# Patient Record
Sex: Male | Born: 1990 | Race: White | Hispanic: No | Marital: Single | Smoking: Former smoker
Health system: Southern US, Community
[De-identification: ages and names within clinical notes are randomized; demographics above are authoritative.]

## PROBLEM LIST (undated history)

## (undated) DIAGNOSIS — E669 Obesity, unspecified: Secondary | ICD-10-CM

## (undated) HISTORY — PX: NO PAST SURGERIES: SHX2092

## (undated) HISTORY — DX: Obesity, unspecified: E66.9

---

## 2014-12-14 ENCOUNTER — Ambulatory Visit: Payer: Managed Care, Other (non HMO)

## 2014-12-14 ENCOUNTER — Ambulatory Visit
Admission: EM | Admit: 2014-12-14 | Discharge: 2014-12-14 | Disposition: A | Discharge status: Home / Self Care | Payer: Managed Care, Other (non HMO) | Attending: Family Medicine | Admitting: Family Medicine

## 2014-12-14 ENCOUNTER — Encounter: Payer: Self-pay | Admitting: Emergency Medicine

## 2014-12-14 DIAGNOSIS — S93401A Sprain of unspecified ligament of right ankle, initial encounter: Secondary | ICD-10-CM | POA: Insufficient documentation

## 2014-12-14 DIAGNOSIS — Z87891 Personal history of nicotine dependence: Secondary | ICD-10-CM | POA: Diagnosis not present

## 2014-12-14 DIAGNOSIS — W010XXA Fall on same level from slipping, tripping and stumbling without subsequent striking against object, initial encounter: Secondary | ICD-10-CM | POA: Insufficient documentation

## 2014-12-14 DIAGNOSIS — M25571 Pain in right ankle and joints of right foot: Secondary | ICD-10-CM | POA: Diagnosis present

## 2014-12-14 NOTE — ED Notes (Signed)
Right ankle pain and swelling today. Was wrestling last night  And fell back with foot behind him.

## 2014-12-14 NOTE — ED Provider Notes (Signed)
CSN: [id number]     Arrival date & time 12/14/14  1410 History   First MD Initiated Contact with Patient 12/14/14 1442     Chief Complaint  Patient presents with  . Ankle Pain   (Consider location/radiation/quality/duration/timing/severity/associated sxs/prior Treatment) HPI  24 yo M rough housing in the yard last night  with friends, slipped backwards with right foot and ankle beneath him-- eversion injury- was very swollen - reports painful to walk - can weight bear- has pain bilateral malleous. Has been on ice all day. Mother brought to [facility] and wants xray done.  History reviewed. No pertinent past medical history. History reviewed. No pertinent past surgical history. Family History  Problem Relation Age of Onset  . Heart attack Paternal Grandfather   . Heart attack Maternal Grandfather    History  Substance Use Topics  . Smoking status: Former Games developer  . Smokeless tobacco: Current User  . Alcohol Use: Yes    Review of Systems  Review of 10 systems negative for acute change except as referenced in HPI  Allergies  Review of patient's allergies indicates no known allergies.  Home Medications   Prior to Admission medications   Not on File   BP 129/82 mmHg  Pulse 72  Temp(Src) 97.9 F (36.6 C) (Oral)  Resp 18  Ht 6\' 2"  (1.88 m)  Wt 225 lb (102.059 kg)  BMI 28.88 kg/m2  SpO2 97% Physical Exam 'Constitutional -alert and oriented,well appearing and in mild right ankle distress Head-atraumatic Eyes- conjunctiva normal, EOMI ,conjugate gaze- contacts Nose- no congestion or rhinorrhea Mouth/throat- mucous membranes moist , Neck- supple  CV- regular rate, grossly normal heart sounds,  Resp-no distress, normal respiratory effort,clear to auscultation bilaterally GI- no distention GU- not examined MSK- Right ankle mild edema , no ecchymosis, complains of pain with palpation over malelous, nonspecifc  left :no lower extremity tenderness nor edema,no joint effusion,  ambulatory Neuro- normal speech and language, no gross focal neurological deficit appreciated, Skin-warm,dry ,intact; no rash noted Psych-mood and affect grossly normal; speech and behavior grossly normal ED Course  Procedures (including critical care time) Labs Review Labs Reviewed - No data to display  Imaging Review Dg Ankle Complete Right  12/14/2014   CLINICAL DATA:  Fall, ankle pain  EXAM: RIGHT ANKLE - COMPLETE 3+ VIEW  COMPARISON:  None.  FINDINGS: There is no evidence of fracture, dislocation, or joint effusion. There is no evidence of arthropathy or other focal bone abnormality. Soft tissues are unremarkable.  IMPRESSION: Negative.   Electronically Signed   By: Christiana Pellant M.D.   On: 12/14/2014 15:47     MDM   1. Ankle sprain, right, initial encounter    Very mild ankle sprain. 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 He requested off work pass for today as he had stayed home. Left department wearing flip flops normal gait.    Rae Halsted, PA-C 12/15/14 2314

## 2014-12-15 ENCOUNTER — Encounter: Payer: Self-pay | Admitting: Physician Assistant

## 2015-05-12 ENCOUNTER — Encounter: Payer: Self-pay | Admitting: Emergency Medicine

## 2015-05-12 ENCOUNTER — Emergency Department
Admission: EM | Admit: 2015-05-12 | Discharge: 2015-05-12 | Disposition: A | Discharge status: Home / Self Care | Payer: Managed Care, Other (non HMO) | Attending: Student | Admitting: Student

## 2015-05-12 DIAGNOSIS — Z79899 Other long term (current) drug therapy: Secondary | ICD-10-CM | POA: Insufficient documentation

## 2015-05-12 DIAGNOSIS — Z87891 Personal history of nicotine dependence: Secondary | ICD-10-CM | POA: Insufficient documentation

## 2015-05-12 DIAGNOSIS — L0201 Cutaneous abscess of face: Secondary | ICD-10-CM | POA: Insufficient documentation

## 2015-05-12 MED ORDER — CLINDAMYCIN HCL 300 MG PO CAPS: 300.0000 mg | ORAL_CAPSULE | Freq: Three times a day (TID) | ORAL | Status: DC

## 2015-05-12 MED ORDER — LIDOCAINE HCL (PF) 1 % IJ SOLN
5.0000 mL | Freq: Once | INTRAMUSCULAR | Status: DC
  Filled 2015-05-12: qty 5

## 2015-05-12 NOTE — Discharge Instructions (Signed)

## 2015-05-12 NOTE — ED Notes (Signed)
Pt to ed with swelling, redness to forehead.  Pt states "I popped a zit last night to relieve some of the pressure"  Pt reports area has been increasing in size and pain x 5 days.  Pt with noted swelling under eyes bilat.

## 2015-05-12 NOTE — ED Provider Notes (Signed)
CSN: [id number]     Arrival date & time 05/12/15  0911 History   First MD Initiated Contact with Patient 05/12/15 [phone]     Chief Complaint  Patient presents with  . Abscess   HPI Comments: 24 year old male presents today complaining of redness and swelling to his forehead for the past 4-5 days. Patient reports he popped a zit on his forehead last night and now he has swelling on his face. He has not had any fevers, sweats or chills. No personal history of MRSA.  The history is provided by the patient.    History reviewed. No pertinent past medical history. History reviewed. No pertinent past surgical history. Family History  Problem Relation Age of Onset  . Heart attack Paternal Grandfather   . Heart attack Maternal Grandfather    Social History  Substance Use Topics  . Smoking status: Former Games developermoker  . Smokeless tobacco: Current User  . Alcohol Use: Yes    Review of Systems  Constitutional: Negative for fever and chills.  Skin: Positive for wound.  All other systems reviewed and are negative.     Allergies  Doxycycline and Septra  Home Medications   Prior to Admission medications   Medication Sig Start Date End Date Taking? Authorizing Provider  doxycycline (VIBRAMYCIN) 100 MG capsule Take 100 mg by mouth 2 (two) times daily.   Yes Historical Provider, MD  clindamycin (CLEOCIN) 300 MG capsule Take 1 capsule (300 mg total) by mouth 3 (three) times daily. 05/12/15   Wilber OliphantEmma Weavil V, PA-C   BP 127/69 mmHg  Pulse 60  Temp(Src) 98.3 F (36.8 C) (Oral)  Resp 20  Ht 6\' 2"  (1.88 m)  Wt 230 lb (104.327 kg)  BMI 29.52 kg/m2  SpO2 96% Physical Exam  Constitutional: He is oriented to person, place, and time. He appears well-developed and well-nourished.  HENT:  Head: Normocephalic and atraumatic.  Neurological: He is oriented to person, place, and time.  Skin: Skin is warm and dry. There is erythema.  Erythematous, indurated area to central forehead, fluctuant   Psychiatric: He has a normal mood and affect. His behavior is normal. Judgment and thought content normal.  Nursing note and vitals reviewed.   ED Course  .Marland Kitchen.Incision and Drainage Date/Time: 05/12/2015 10:30 AM Performed by: Luvenia ReddenWEAVIL, Maddi Collar V Authorized by: Luvenia ReddenWEAVIL, Dainelle Hun V Consent: Verbal consent obtained. Written consent not obtained. Risks and benefits: risks, benefits and alternatives were discussed Consent given by: patient Patient understanding: patient states understanding of the procedure being performed Patient consent: the patient's understanding of the procedure matches consent given Procedure consent: procedure consent matches procedure scheduled Relevant documents: relevant documents present and verified Required items: required blood products, implants, devices, and special equipment available Patient identity confirmed: verbally with patient Time out: Immediately prior to procedure a "time out" was called to verify the correct patient, procedure, equipment, support staff and site/side marked as required. Type: abscess Body area: head Location details: face Anesthesia: local infiltration Local anesthetic: lidocaine 1% without epinephrine Anesthetic total: 4 ml Patient sedated: no Scalpel size: 11 Incision type: single straight Incision depth: dermal Complexity: simple Drainage: purulent Drainage amount: scant Wound treatment: drain placed and  wound left open Packing material: none Patient tolerance: Patient tolerated the procedure well with no immediate complications   (including critical care time) Labs Review Labs Reviewed - No data to display  Imaging Review No results found. I have personally reviewed and evaluated these images and lab results as part of my medical decision-making.  EKG Interpretation None      MDM  Wound incised and drained as above. Due to drug allergies will treat with clindamycin TID for 10 days Advised patient that should the  swelling or area of redness increase, or if he develops, fever/chills he should return to the ER. Otherwise complete abx course as directed and clean wound daily with soap and water, apply warm compresses  Final diagnoses:  Abscess of forehead        Luvenia Redden, PA-C 05/12/15 1045  Gayla Doss, MD 05/13/15 1320

## 2016-03-12 IMAGING — CR DG ANKLE COMPLETE 3+V*R*
3 series · 3 of 3 positions shown · non-contrast
Comparison: None.

CLINICAL DATA: Fall, ankle pain

EXAM:
RIGHT ANKLE - COMPLETE 3+ VIEW

[ankle ap]
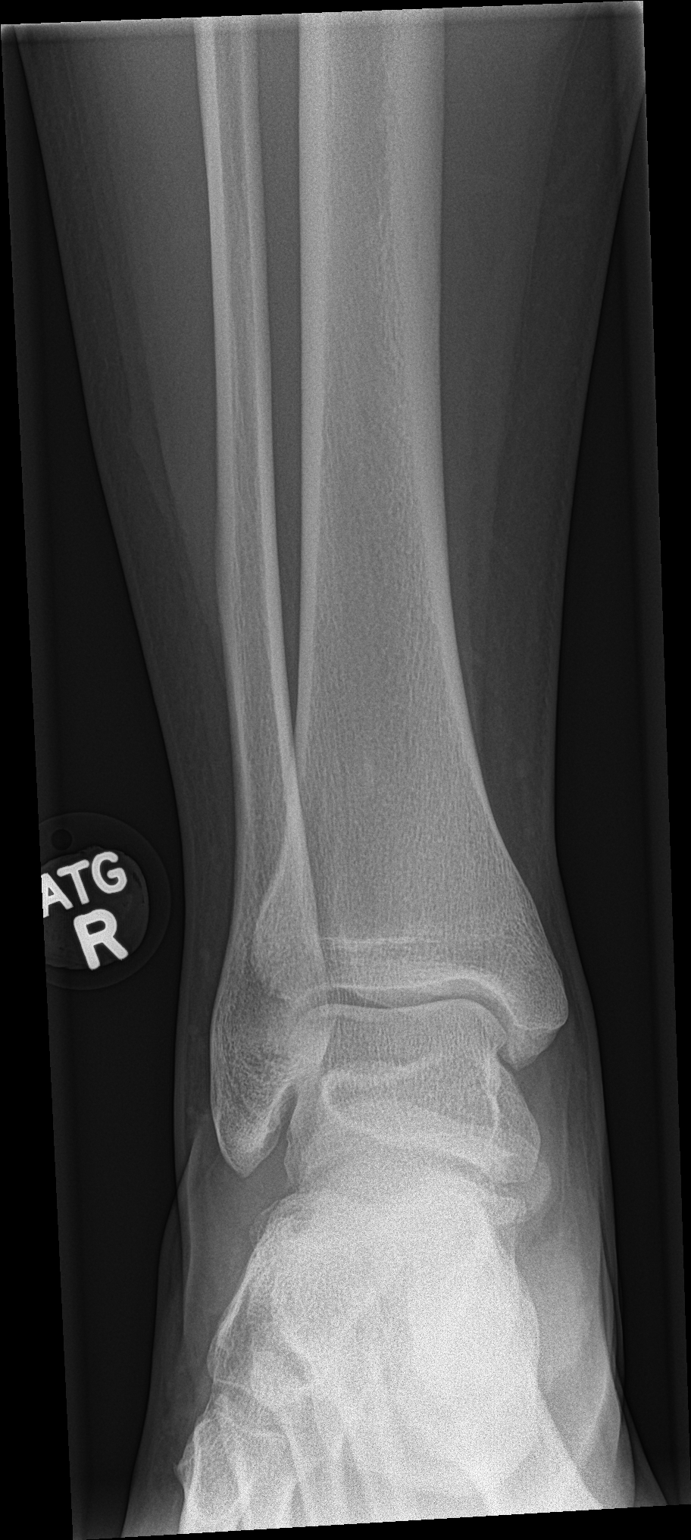

[ankle obl]
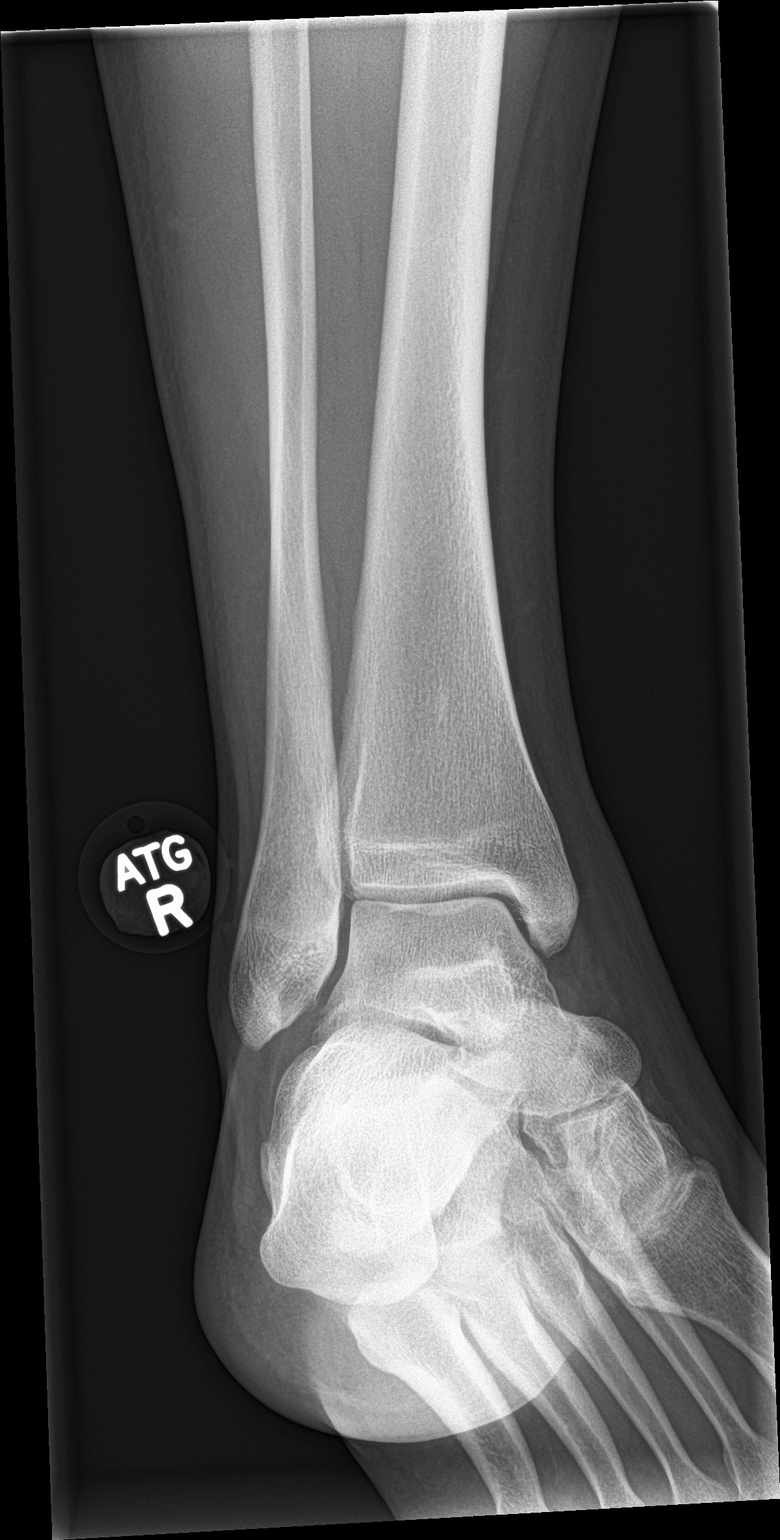

[ankle lat]
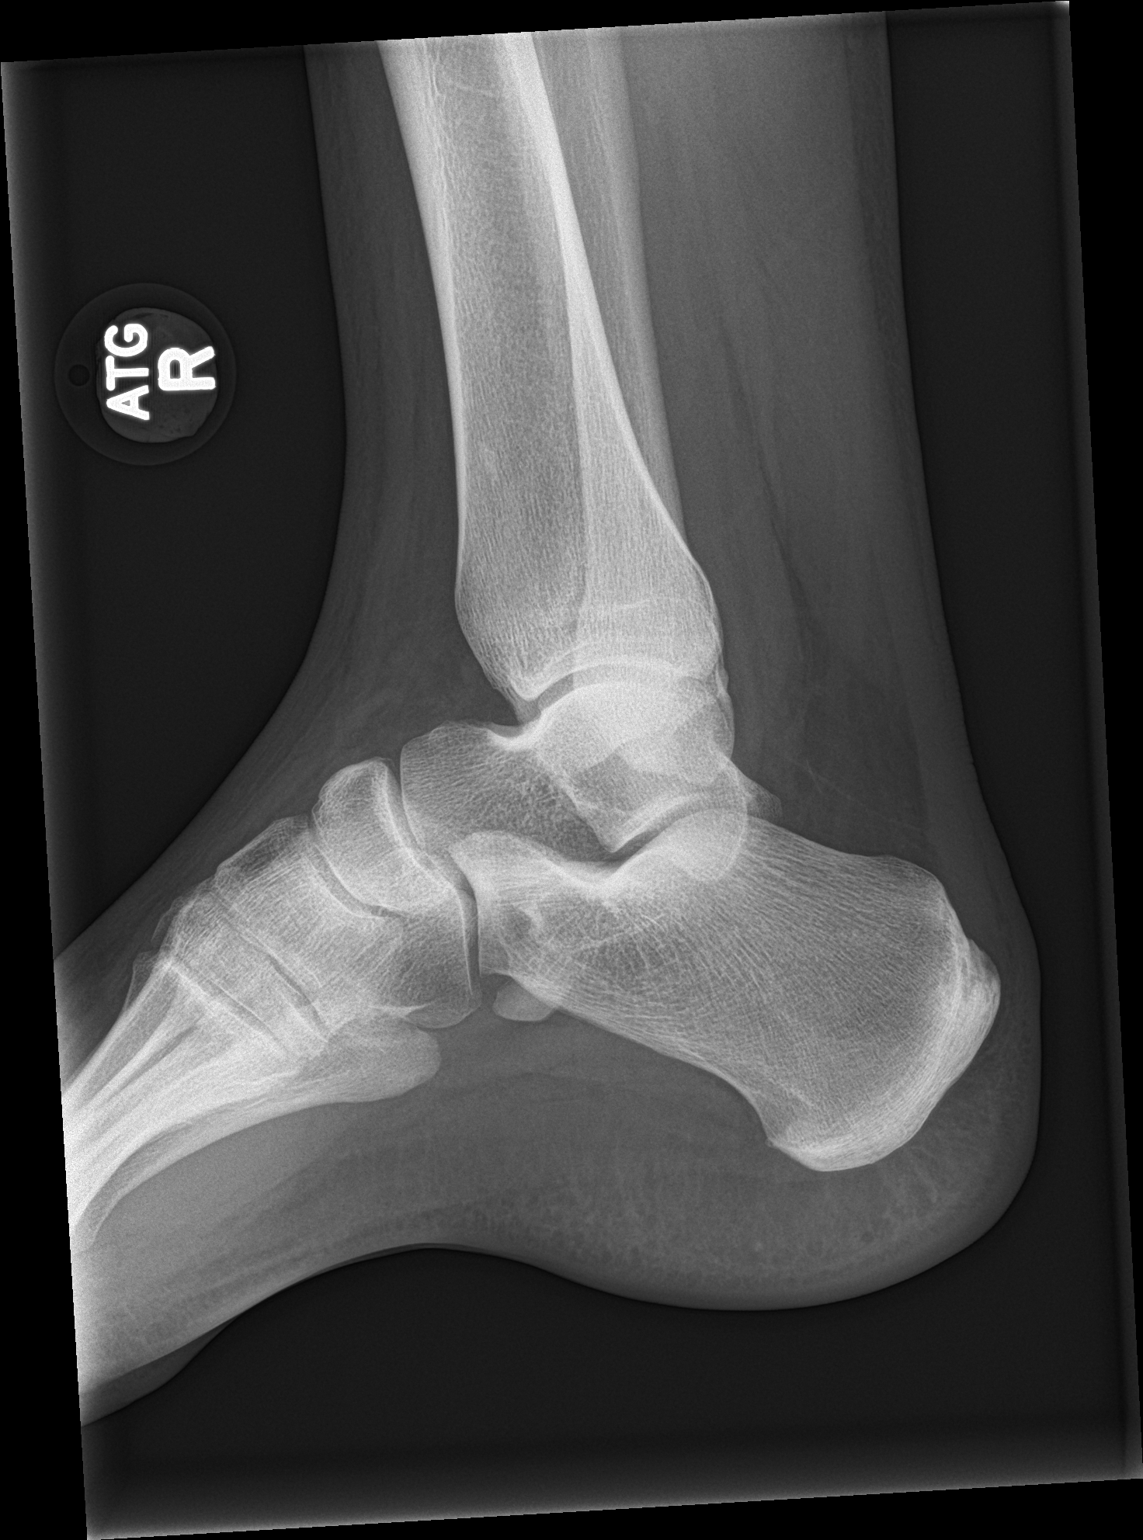

[3 of 3 positions shown; findings below may reference images not displayed]

FINDINGS: There is no evidence of fracture, dislocation, or joint effusion.
There is no evidence of arthropathy or other focal bone abnormality.
Soft tissues are unremarkable.
IMPRESSION: Negative.

## 2016-03-14 ENCOUNTER — Ambulatory Visit (INDEPENDENT_AMBULATORY_CARE_PROVIDER_SITE_OTHER): Payer: Self-pay | Admitting: Neurology

## 2016-03-14 ENCOUNTER — Encounter: Payer: Self-pay | Admitting: Neurology

## 2016-03-14 VITALS — BP 134/80 | HR 59 | Ht 74.0 in | Wt 253.8 lb

## 2016-03-14 DIAGNOSIS — G510 Bell's palsy: Secondary | ICD-10-CM

## 2016-03-14 MED ORDER — PREDNISONE 20 MG PO TABS: ORAL_TABLET | ORAL | 1 refills | Status: AC

## 2016-03-14 NOTE — Progress Notes (Signed)
[facility]    Provider:  Dr Lucia Gaskins  CC:  Bells palsy  HPI:  Austin Russo is a 25 y.o. male here as a referral for Bells palsy started a week ago. PMHx obesity, HTN.  He noticed his right eye was drying out at home. When he woke up the next day right Sunday he had a facial droop. The patient also noticed he was having trouble drinking, he had taste dysfunction, and noises were much louder. He also had difficulty closing his right eye. Patient has been using eyedrops in the eye. It is been uncomfortable for him and his eye was read for several days which has improved. He still has Trouble drinking. He still has taste difference. It is getting better. Much easier to drink now. Prior to onset patient had an upper respiratory infection. No other associated symptoms or modifying factors. Patient has no history of stroke however there is history of stroke in the family. No drug or alcohol use. No rashes.  Review of Systems: Patient complains of symptoms per HPI as well as the following symptoms: headache, eye pain. Pertinent negatives per HPI. All others negative.   Social History   Social History  . Marital status: Single    Spouse name: N/A  . Number of children: 0  . Years of education: 12   Occupational History  . [employer]    Social History Main Topics  . Smoking status: Former Games developer  . Smokeless tobacco: Current User     Comment: e-cig  . Alcohol use Yes     Comment: 3-4 per week  . Drug use: No  . Sexual activity: Yes    Birth control/ protection: Condom   Other Topics Concern  . Not on file   Social History Narrative   Lives with girlfriend, Ladona Ridgel   Caffeine use:  Soda- 12 pack per week    Family History  Problem Relation Age of Onset  . Heart attack Paternal Grandfather   . Heart attack Maternal Grandfather   . Heart Problems Maternal Grandmother   . Stroke Maternal Uncle     Past Medical History:  Diagnosis Date  . Obesity     Past  Surgical History:  Procedure Laterality Date  . NO PAST SURGERIES      Current Outpatient Prescriptions  Medication Sig Dispense Refill  . predniSONE (DELTASONE) 20 MG tablet Days 1-4 60mg  daily. Day five 50mg . Day six 40mg . Day Seven 30mg . Day eight 20mg . Day 9 10 25  tablet 1   No current facility-administered medications for this visit.     Allergies as of 03/14/2016 - Review Complete 05/12/2015  Allergen Reaction Noted  . Doxycycline Hives 05/12/2015  . Septra [sulfamethoxazole-trimethoprim] Rash 05/12/2015    Vitals: BP 134/80 (BP Location: Right Arm, Patient Position: Sitting, Cuff Size: Large)   Pulse (!) 59   Ht 6\' 2"  (1.88 m)   Wt 253 lb 12.8 oz (115.1 kg)   BMI 32.59 kg/m  Last Weight:  Wt Readings from Last 1 Encounters:  03/14/16 253 lb 12.8 oz (115.1 kg)   Last Height:   Ht Readings from Last 1 Encounters:  03/14/16 6\' 2"  (1.88 m)   Physical exam: Exam: Gen: NAD, conversant, well nourised, obese, well groomed                     CV: RRR, no MRG. No Carotid Bruits. No peripheral edema, warm, nontender Eyes: Mild conjunctival injection right eye  Neuro: Detailed Neurologic  Exam  Speech:    Speech is normal; fluent and spontaneous with normal comprehension.  Cognition:    The patient is oriented to person, place, and time;     recent and remote memory intact;     language fluent;     normal attention, concentration,     fund of knowledge Cranial Nerves:    The pupils are equal, round, and reactive to light. The fundi are normal and spontaneous venous pulsations are present. Visual fields are full to finger confrontation. Extraocular movements are intact. Trigeminal sensation is intact and the muscles of mastication are normal. Right facial weakness, right facial droop and nasolabial flattening, and difficulty elevating right eyebrow, impaired eye closure with Bell's phenomena. The palate elevates in the midline. Hearing intact. Voice is normal. Shoulder  shrug is normal. The tongue has normal motion without fasciculations.   Coordination:    Normal finger to nose and heel to shin.  Gait:    Heel-toe and tandem gait are normal.   Motor Observation:    No asymmetry, no atrophy, and no involuntary movements noted. Tone:    Normal muscle tone.    Posture:    Posture is normal. normal erect    Strength:    Strength is V/V in the upper and lower limbs.      Sensation: intact to LT     Reflex Exam:  DTR's:    Deep tendon reflexes in the upper and lower extremities are normal bilaterally.   Toes:    The toes are downgoing bilaterally.   Clonus:    Clonus is absent.       Assessment/Plan:  25 year old male with right-sided Bell's palsy. Discussed hypertension, wearing glasses, keeping the eye well-lubricated during the day and at night to protect from corneal abrasions or corneal damage, patching of the eye at night with special care not to touch the cornea as this can cause significant damage and permanent vision changes. Discussed lab work today which can include Lyme, sarcoid, syphilis, HIV and others however patient declines due to uninsured status. Patient also declines MRI of the brain due to uninsured status and this would likely just confirm seventh nerve palsy and not change our management anyway. Patient has had symptoms for a week at this point starting him on antivirals and not indicated but we'll start him on high-dose prednisone with a taper over 10 days. Discussed Bell's palsy and viral etiology likely. Patient had an upper respiratory infection previous to symptoms. If symptoms do not significantly improve in the next several months or if patient develops repeat Bell's palsy he is to follow-up with me for further testing. Also encouraged patient to follow-up with primary care.  Naomie DeanAntonia Ahern, MD  [facility] [address] [place], [place] [id number]  Phone [phone] Fax  [phone]

## 2016-03-14 NOTE — Patient Instructions (Signed)
Overall you are doing fairly well but I do want to suggest a few things today:   Remember to drink plenty of fluid, eat healthy meals and do not skip any meals. Try to eat protein with a every meal and eat a healthy snack such as fruit or nuts in between meals. Try to keep a regular sleep-wake schedule and try to exercise daily, particularly in the form of walking, 20-30 minutes a day, if you can.   As far as your medications are concerned, I would like to suggest Days 1-4 60mg  in the morning with food Day five 50mg  Day six 40mg  Day seven 30mg  Day eight 20mg  Day nine 10mg    Our phone number is [phone]. We also have an after hours call service for urgent matters and there is a physician on-call for urgent questions. For any emergencies you know to call 911 or go to the nearest emergency room

## 2016-03-15 DIAGNOSIS — G510 Bell's palsy: Secondary | ICD-10-CM | POA: Insufficient documentation

## 2018-04-10 ENCOUNTER — Other Ambulatory Visit: Payer: Self-pay

## 2018-04-10 ENCOUNTER — Emergency Department
Admission: EM | Admit: 2018-04-10 | Discharge: 2018-04-10 | Disposition: A | Discharge status: Home / Self Care | Payer: Worker's Compensation | Attending: Emergency Medicine | Admitting: Emergency Medicine

## 2018-04-10 ENCOUNTER — Encounter: Payer: Self-pay | Admitting: Emergency Medicine

## 2018-04-10 DIAGNOSIS — Y929 Unspecified place or not applicable: Secondary | ICD-10-CM | POA: Diagnosis not present

## 2018-04-10 DIAGNOSIS — F17290 Nicotine dependence, other tobacco product, uncomplicated: Secondary | ICD-10-CM | POA: Insufficient documentation

## 2018-04-10 DIAGNOSIS — Y990 Civilian activity done for income or pay: Secondary | ICD-10-CM | POA: Insufficient documentation

## 2018-04-10 DIAGNOSIS — Y9389 Activity, other specified: Secondary | ICD-10-CM | POA: Insufficient documentation

## 2018-04-10 DIAGNOSIS — S0003XA Contusion of scalp, initial encounter: Secondary | ICD-10-CM | POA: Diagnosis not present

## 2018-04-10 DIAGNOSIS — S0990XA Unspecified injury of head, initial encounter: Secondary | ICD-10-CM

## 2018-04-10 DIAGNOSIS — W228XXA Striking against or struck by other objects, initial encounter: Secondary | ICD-10-CM | POA: Diagnosis not present

## 2018-04-10 MED ORDER — BACITRACIN-NEOMYCIN-POLYMYXIN 400-5-5000 EX OINT
TOPICAL_OINTMENT | Freq: Three times a day (TID) | CUTANEOUS | Status: DC
  Administered 2018-04-10: 1 via TOPICAL
  Filled 2018-04-10: qty 2

## 2018-04-10 NOTE — ED Provider Notes (Signed)
[facility] Emergency Department Provider Note   ____________________________________________   First MD Initiated Contact with Patient 04/10/18 1320     (approximate)  I have reviewed the triage vital signs and the nursing notes.   HISTORY  Chief Complaint Head Injury    HPI Austin Russo is a 27 y.o. male presents with contusion to the scalp.  Patient state he was at work when a metal gate open hit the top of his head.  Patient denies LOC or fall from injury.  Patient denies vision disturbance or vertigo.  Patient denies nausea or vomiting.  Patient sustained an abrasion to the top of his head.  Patient rates his pain as a 4/10.  Patient described the pain is "ache".  No palliative measure for complaint.  Incident occurred approximately 2 hours ago.  Past Medical History:  Diagnosis Date  . Obesity     Patient Active Problem List   Diagnosis Date Noted  . Bell's palsy 03/15/2016    Past Surgical History:  Procedure Laterality Date  . NO PAST SURGERIES      Prior to Admission medications   Medication Sig Start Date End Date Taking? Authorizing Provider  predniSONE (DELTASONE) 20 MG tablet Days 1-4 60mg  daily. Day five 50mg . Day six 40mg . Day Seven 30mg . Day eight 20mg . Day 9 10 03/14/16   Anson Fret, MD    Allergies Doxycycline and Septra [sulfamethoxazole-trimethoprim]  Family History  Problem Relation Age of Onset  . Heart attack Paternal Grandfather   . Heart attack Maternal Grandfather   . Heart Problems Maternal Grandmother   . Stroke Maternal Uncle     Social History Social History   Tobacco Use  . Smoking status: Former Games developer  . Smokeless tobacco: Current User  . Tobacco comment: e-cig  Substance Use Topics  . Alcohol use: Yes    Comment: 3-4 per week  . Drug use: No    Review of Systems  Constitutional: No fever/chills Eyes: No visual changes. ENT: No sore throat. Cardiovascular: Denies chest  pain. Respiratory: Denies shortness of breath. Gastrointestinal: No abdominal pain.  No nausea, no vomiting.  No diarrhea.  No constipation. Genitourinary: Negative for dysuria. Musculoskeletal: Negative for back pain. Skin: Mild scalp abrasion Neurological: Negative for headaches, focal weakness or numbness. Allergic/Immunilogical: See medication list. ____________________________________________   PHYSICAL EXAM:  VITAL SIGNS: ED Triage Vitals  Enc Vitals Group     BP 04/10/18 1255 136/84     Pulse Rate 04/10/18 1255 65     Resp 04/10/18 1255 18     Temp 04/10/18 1255 98.5 F (36.9 C)     Temp Source 04/10/18 1255 Oral     SpO2 04/10/18 1255 97 %     Weight 04/10/18 1255 240 lb (108.9 kg)     Height 04/10/18 1255 6\' 2"  (1.88 m)     Head Circumference --      Peak Flow --      Pain Score 04/10/18 1303 4     Pain Loc --      Pain Edu? --      Excl. in GC? --     Constitutional: Alert and oriented. Well appearing and in no acute distress. Eyes: Conjunctivae are normal. PERRL. EOMI. Head: Atraumatic. Neck: No cervical spine tenderness to palpation. Cardiovascular: Normal rate, regular rhythm. Grossly normal heart sounds.  Good peripheral circulation. Respiratory: Normal respiratory effort.  No retractions. Lungs CTAB. Neurologic:  Normal speech and language. No gross focal neurologic  deficits are appreciated. No gait instability. Skin:  Skin is warm, dry and intact. No rash noted. Psychiatric: Mood and affect are normal. Speech and behavior are normal.  ____________________________________________   LABS (all labs ordered are listed, but only abnormal results are displayed)  Labs Reviewed - No data to display ____________________________________________  EKG   ____________________________________________  RADIOLOGY  ED MD interpretation:    Official radiology report(s): No results  found.  ____________________________________________   PROCEDURES  Procedure(s) performed: None  Procedures  Critical Care performed: No  ____________________________________________   INITIAL IMPRESSION / ASSESSMENT AND PLAN / ED COURSE  As part of my medical decision making, I reviewed the following data within the electronic MEDICAL RECORD NUMBER    Minor head injury secondary to contusion.  Patient also sustained an abrasion to the superior aspect of the scalp.  Patient given discharge care instructions include heat watch sheet.  Patient advised only Tylenol as needed for headache/pain.  Patient advised return back to ED if condition worsens.  Patient may return back to work with limitations.      ____________________________________________   FINAL CLINICAL IMPRESSION(S) / ED DIAGNOSES  Final diagnoses:  Minor head injury, initial encounter     ED Discharge Orders    None       Note:  This document was prepared using Dragon voice recognition software and may include unintentional dictation errors.    Joni Reining, PA-C 04/10/18 1332    Nita Sickle, MD 04/11/18 [phone]

## 2018-04-10 NOTE — Discharge Instructions (Addendum)
Follow discharge care instruction take only Tylenol as needed for headache.  Apply Neosporin to the scalp abrasion.

## 2018-04-10 NOTE — ED Notes (Signed)
See triage note  Presents with head injury  States he was hit on top of head with metal gate at work  Abrasion noted to top of head  No LOC

## 2018-04-10 NOTE — ED Triage Notes (Addendum)
Pt to ED via POV with c/o metal gait hitting head at work. Denies any LOC or fall with injury. Light abrasion noted to top of head. PT A&OX4. PT works at [employer]. Health nurse from work is accompanying pt. VSS . PT workers comp

## 2019-04-03 ENCOUNTER — Other Ambulatory Visit: Payer: Self-pay

## 2019-04-03 DIAGNOSIS — Z20822 Contact with and (suspected) exposure to covid-19: Secondary | ICD-10-CM

## 2019-04-04 LAB — NOVEL CORONAVIRUS, NAA: SARS-CoV-2, NAA: NOT DETECTED

## 2022-06-11 ENCOUNTER — Ambulatory Visit
Admission: EM | Admit: 2022-06-11 | Discharge: 2022-06-11 | Disposition: A | Discharge status: Home / Self Care | Payer: No Typology Code available for payment source

## 2022-06-11 DIAGNOSIS — R1031 Right lower quadrant pain: Secondary | ICD-10-CM

## 2022-06-11 DIAGNOSIS — R112 Nausea with vomiting, unspecified: Secondary | ICD-10-CM

## 2022-06-11 NOTE — ED Triage Notes (Addendum)
Pt c/o vomiting, diarrhea on Thursday, pt states he woke up with excruciating pain around 3:30 this am RLQ, pt reports he does still have his appendix, pt reports feels like someone is stabbing him. Pt reports last bowel movement was yesterday around 2pm, pt states he has not had a bowel movement since then. Pt states he normally goes x2-3 times within a day

## 2022-06-11 NOTE — Discharge Instructions (Signed)
TO ER for further evaluation and treatment

## 2022-06-11 NOTE — ED Notes (Addendum)
Patient is being discharged from the Urgent Care and sent to the Emergency Department via POV . Per Cheri Rous, NP, patient is in need of higher level of care due to severe abdominal pain. Patient is aware and verbalizes understanding of plan of care.  Vitals:   06/11/22 1536  BP: (!) 155/104  Pulse: 93  Temp: 98.3 F (36.8 C)  SpO2: 100%

## 2022-06-11 NOTE — ED Provider Notes (Addendum)
[facility]    CSN: [id number] Arrival date & time: 06/11/22  1420      History   Chief Complaint Chief Complaint  Patient presents with   Abdominal Pain    RLQ    HPI Austin Russo is a 31 y.o. male presents for evaluation of abdominal pain.  Patient reports for 2 days he has been having persistent nonbloody vomiting with diarrhea.  States last episode was yesterday.  He was awoken early morning today with severe abdominal pain in his right lower quadrant that has been persistent since that time.  Pain does not radiate.  He is having nausea and chills.  Denies any URI symptoms.  Denies any history of abdominal surgeries.  No recent travel or ingestion of contaminated or undercooked foods.  He has had very little fluids in the past 24 hours and states he has not urinated today.  He has not taken any OTC medications.  Patient rates his abdominal pain is a 10 out of 10.  No other concerns at this time.   Abdominal Pain Associated symptoms: diarrhea, nausea and vomiting     Past Medical History:  Diagnosis Date   Obesity     Patient Active Problem List   Diagnosis Date Noted   Bell's palsy 03/15/2016    Past Surgical History:  Procedure Laterality Date   NO PAST SURGERIES         Home Medications    Prior to Admission medications   Medication Sig Start Date End Date Taking? Authorizing Provider  predniSONE (DELTASONE) 20 MG tablet Days 1-4 60mg  daily. Day five 50mg . Day six 40mg . Day Seven 30mg . Day eight 20mg . Day 9 10 03/14/16   , MD    Family History Family History  Problem Relation Age of Onset   Heart attack Paternal Grandfather    Heart attack Maternal Grandfather    Heart Problems Maternal Grandmother    Stroke Maternal Uncle     Social History Social History   Tobacco Use   Smoking status: Former   Smokeless tobacco: Current   Tobacco comments:    Use: Every day  Substance Use Topics    Alcohol use: Yes    Comment: 3-4 per week   Drug use: No     Allergies   Doxycycline and Septra [sulfamethoxazole-trimethoprim]   Review of Systems Review of Systems  Gastrointestinal:  Positive for abdominal pain, diarrhea, nausea and vomiting.     Physical Exam Triage Vital Signs ED Triage Vitals  Enc Vitals Group     BP 06/11/22 1536 (!) 155/104     Pulse Rate 06/11/22 1536 93     Resp --      Temp 06/11/22 1536 98.3 F (36.8 C)     Temp Source 06/11/22 1536 Oral     SpO2 06/11/22 1536 100 %     Weight 06/11/22 1534 280 lb (127 kg)     Height 06/11/22 1534 6\' 2"  (1.88 m)     Head Circumference --      Peak Flow --      Pain Score 06/11/22 1534 10     Pain Loc --      Pain Edu? --      Excl. in GC? --    No data found.  Updated Vital Signs BP (!) 155/104 (BP Location: Right Arm)   Pulse 93   Temp 98.3 F (36.8 C) (Oral)   Ht  6\' 2"  (1.88 m)   Wt 280 lb (127 kg)   SpO2 100%   BMI 35.95 kg/m   Visual Acuity Right Eye Distance:   Left Eye Distance:   Bilateral Distance:    Right Eye Near:   Left Eye Near:    Bilateral Near:     Physical Exam Vitals and nursing note reviewed.  Constitutional:      Appearance: He is well-developed.  HENT:     Head: Normocephalic and atraumatic.  Eyes:     Pupils: Pupils are equal, round, and reactive to light.  Cardiovascular:     Rate and Rhythm: Normal rate.  Pulmonary:     Effort: Pulmonary effort is normal.  Abdominal:     General: Bowel sounds are decreased.     Palpations: Abdomen is soft.     Tenderness: There is abdominal tenderness in the right lower quadrant. There is no right CVA tenderness, left CVA tenderness or guarding. Negative signs include Rovsing's sign and McBurney's sign.     Comments: Abdomen is obese  Skin:    General: Skin is warm and dry.  Neurological:     General: No focal deficit present.     Mental Status: He is alert and oriented to person, place, and time.  Psychiatric:         Mood and Affect: Mood normal.        Behavior: Behavior normal.      UC Treatments / Results  Labs (all labs ordered are listed, but only abnormal results are displayed) Labs Reviewed - No data to display  EKG   Radiology No results found.  Procedures Procedures (including critical care time)  Medications Ordered in UC Medications - No data to display  Initial Impression / Assessment and Plan / UC Course  I have reviewed the triage vital signs and the nursing notes.  Pertinent labs & imaging results that were available during my care of the patient were reviewed by me and considered in my medical decision making (see chart for details).     Reviewed exam and symptoms with patient.  Discussed limitations and abilities of urgent care.  Given his onset of 10 out of 10 right lower quadrant abdominal pain with nausea/vomiting advised ER evaluation for further workup and treatment.  Patient is in agreement plan will go POV to emergency room with his spouse driving.  He was instructed to pull over and call 911 for any worsening symptoms that occur in transit and he verbalized understanding Final Clinical Impressions(s) / UC Diagnoses   Final diagnoses:  RLQ abdominal pain  Nausea and vomiting, unspecified vomiting type     Discharge Instructions      TO ER for further evaluation and treatment      ED Prescriptions   None    PDMP not reviewed this encounter.   , NP 06/11/22 1619    06/13/22, NP 06/11/22 1620
